# Patient Record
Sex: Male | Born: 1990 | Race: Black or African American | Hispanic: No | Marital: Single | Smoking: Never smoker
Health system: Southern US, Community
[De-identification: ages and names within clinical notes are randomized; demographics above are authoritative.]

---
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
## 2011-07-11 ENCOUNTER — Emergency Department (HOSPITAL_COMMUNITY)
Admission: EM | Admit: 2011-07-11 | Discharge: 2011-07-11 | Disposition: A | Discharge status: Home / Self Care | Payer: Self-pay | Attending: Emergency Medicine | Admitting: Emergency Medicine

## 2011-07-11 ENCOUNTER — Emergency Department (HOSPITAL_COMMUNITY): Payer: Self-pay

## 2011-07-11 DIAGNOSIS — M25473 Effusion, unspecified ankle: Secondary | ICD-10-CM | POA: Insufficient documentation

## 2011-07-11 DIAGNOSIS — X500XXA Overexertion from strenuous movement or load, initial encounter: Secondary | ICD-10-CM | POA: Insufficient documentation

## 2011-07-11 DIAGNOSIS — M25579 Pain in unspecified ankle and joints of unspecified foot: Secondary | ICD-10-CM | POA: Insufficient documentation

## 2011-07-11 DIAGNOSIS — Y92838 Other recreation area as the place of occurrence of the external cause: Secondary | ICD-10-CM | POA: Insufficient documentation

## 2011-07-11 DIAGNOSIS — S8990XA Unspecified injury of unspecified lower leg, initial encounter: Secondary | ICD-10-CM | POA: Insufficient documentation

## 2011-07-11 DIAGNOSIS — S93409A Sprain of unspecified ligament of unspecified ankle, initial encounter: Secondary | ICD-10-CM | POA: Insufficient documentation

## 2011-07-11 DIAGNOSIS — Y9367 Activity, basketball: Secondary | ICD-10-CM | POA: Insufficient documentation

## 2011-07-11 DIAGNOSIS — Y9239 Other specified sports and athletic area as the place of occurrence of the external cause: Secondary | ICD-10-CM | POA: Insufficient documentation

## 2011-07-11 DIAGNOSIS — M25476 Effusion, unspecified foot: Secondary | ICD-10-CM | POA: Insufficient documentation

## 2014-07-03 ENCOUNTER — Ambulatory Visit (INDEPENDENT_AMBULATORY_CARE_PROVIDER_SITE_OTHER): Payer: Self-pay | Admitting: Physician Assistant

## 2014-07-03 VITALS — BP 100/70 | HR 74 | Temp 98.3°F | Resp 16 | Ht 75.0 in | Wt 184.2 lb

## 2014-07-03 DIAGNOSIS — Z23 Encounter for immunization: Secondary | ICD-10-CM

## 2014-07-03 NOTE — Progress Notes (Signed)
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  He is UTD on his other vaccinations.

## 2016-02-11 ENCOUNTER — Emergency Department (HOSPITAL_COMMUNITY): Payer: BLUE CROSS/BLUE SHIELD

## 2016-02-11 ENCOUNTER — Encounter (HOSPITAL_COMMUNITY): Payer: Self-pay | Admitting: Emergency Medicine

## 2016-02-11 ENCOUNTER — Emergency Department (HOSPITAL_COMMUNITY)
Admission: EM | Admit: 2016-02-11 | Discharge: 2016-02-11 | Disposition: A | Discharge status: Home / Self Care | Payer: BLUE CROSS/BLUE SHIELD | Attending: Emergency Medicine | Admitting: Emergency Medicine

## 2016-02-11 DIAGNOSIS — Z791 Long term (current) use of non-steroidal anti-inflammatories (NSAID): Secondary | ICD-10-CM | POA: Insufficient documentation

## 2016-02-11 DIAGNOSIS — R091 Pleurisy: Secondary | ICD-10-CM

## 2016-02-11 DIAGNOSIS — Z79899 Other long term (current) drug therapy: Secondary | ICD-10-CM | POA: Diagnosis not present

## 2016-02-11 DIAGNOSIS — R071 Chest pain on breathing: Secondary | ICD-10-CM | POA: Diagnosis present

## 2016-02-11 MED ORDER — IBUPROFEN 600 MG PO TABS: 600.0000 mg | ORAL_TABLET | Freq: Four times a day (QID) | ORAL | Status: AC | PRN

## 2016-02-11 NOTE — ED Provider Notes (Signed)
CSN: [id number]     Arrival date & time 02/11/16  1256 History   First MD Initiated Contact with Patient 02/11/16 1504     Chief Complaint  Patient presents with  . Chest Pain     HPI  Expand All Collapse All   Pt states he's having left lower rib pain, "almost underneath my ribs," over the last week. States it hurts more to breathe in and lay on his left side. Pt feels hot and diaphoretic in triage. Denies SOB, but Pain worsens when he takes a deep breath.  Pain is worse when he lies down and gets better when he sits up or stands up.  Patient denies any definite fever.          History reviewed. No pertinent past medical history. History reviewed. No pertinent past surgical history. History reviewed. No pertinent family history. Social History  Substance Use Topics  . Smoking status: Never Smoker   . Smokeless tobacco: None  . Alcohol Use: Yes     Comment: socially    Review of Systems  Constitutional: Negative for fever and diaphoresis.  Cardiovascular: Positive for chest pain. Negative for palpitations and leg swelling.  All other systems reviewed and are negative.     Allergies  Review of patient's allergies indicates no known allergies.  Home Medications   Prior to Admission medications   Medication Sig Start Date End Date Taking? Authorizing Provider  acetaminophen (TYLENOL) 500 MG tablet Take 1,000 mg by mouth every 6 (six) hours as needed for mild pain.   Yes Historical Provider, MD  ibuprofen (ADVIL,MOTRIN) 600 MG tablet Take 1 tablet (600 mg total) by mouth every 6 (six) hours as needed. 02/11/16   Nelva Nay, MD   BP 107/58 mmHg  Pulse 45  Temp(Src) 99.1 F (37.3 C) (Oral)  Resp 18  SpO2 97% Physical Exam  Constitutional: He is oriented to person, place, and time. He appears well-developed and well-nourished. No distress.  HENT:  Head: Normocephalic and atraumatic.  Eyes: Pupils are equal, round, and reactive to light.  Neck: Normal range of motion.   Cardiovascular: Normal rate and intact distal pulses.   Pulmonary/Chest: Effort normal. No respiratory distress. He has no wheezes. He has no rales.    Abdominal: Soft. Normal appearance and bowel sounds are normal. He exhibits no distension. There is no tenderness. There is no rebound.  Musculoskeletal: Normal range of motion.  Neurological: He is alert and oriented to person, place, and time. No cranial nerve deficit.  Skin: Skin is warm and dry. No rash noted.  Psychiatric: He has a normal mood and affect. His behavior is normal.  Nursing note and vitals reviewed.   ED Course  Procedures (including critical care time) Labs Review Labs Reviewed - No data to display  Imaging Review Dg Chest 2 View  02/11/2016  CLINICAL DATA:  Chest for 4 day EXAM: CHEST  2 VIEW COMPARISON:  None. FINDINGS: Normal heart size. Lungs clear. No pneumothorax. No pleural effusion. IMPRESSION: No active cardiopulmonary disease. Electronically Signed   By: Jolaine Click M.D.   On: 02/11/2016 13:39   I have personally reviewed and evaluated these images and lab results as part of my medical decision-making.   EKG Interpretation   Date/Time:  Thursday February 11 2016 13:24:50 EDT Ventricular Rate:  51 PR Interval:  147 QRS Duration: 94 QT Interval:  412 QTC Calculation: 379 R Axis:   93 Text Interpretation:  Sinus rhythm Borderline right axis deviation  LVH by  voltage No previous tracing Confirmed by Radford PaxBEATON  MD, Ciera Beckum ([id number]) on  02/11/2016 3:10:36 PM      MDM   Final diagnoses:  Pleurisy        Nelva Nayobert Norvell Caswell, MD 02/11/16 [phone]

## 2016-02-11 NOTE — ED Notes (Signed)
Dr. Radford PaxBeaton aware of pt's HR at discharge.

## 2016-02-11 NOTE — ED Notes (Signed)
Pt states he's having left lower rib pain, "almost underneath my ribs," over the last week. States it hurts more to breathe in and lay on his left side. Pt feels hot and diaphoretic in triage. Denies SOB, but unable to take a deep breath

## 2016-02-11 NOTE — Discharge Instructions (Signed)
Pleurisy °Pleurisy is redness, puffiness (swelling), and soreness (inflammation) of the lining of the lungs. It can be hard to breathe and hurt to breathe. Coughing or deep breathing will make it hurt more. It is often caused by an existing infection or disease.  °HOME CARE °· Only take medicine as told by your doctor. °· Only take antibiotic medicine as directed. Make sure to finish it even if you start to feel better. °GET HELP RIGHT AWAY IF:  °· Your lips, fingernails, or toenails are blue or dark. °· You cough up blood. °· You have a hard time breathing. °· Your pain is not controlled with medicine or it lasts for more than 1 week. °· Your pain spreads (radiates) into your neck, arms, or jaw. °· You are short of breath or wheezing. °· You develop a fever, rash, throw up (vomit), or faint. °MAKE SURE YOU:  °· Understand these instructions. °· Will watch your condition. °· Will get help right away if you are not doing well or get worse. °  °This information is not intended to replace advice given to you by your health care provider. Make sure you discuss any questions you have with your health care provider. °  °Document Released: 09/15/2008 Document Revised: 06/05/2013 Document Reviewed: 03/17/2013 °[employer] Interactive Patient Education ©2016 [employer] ° °
